# Patient Record
Sex: Female | Born: 1965 | Hispanic: No | Marital: Married | Smoking: Never smoker
Health system: Southern US, Community
[De-identification: ages and names within clinical notes are randomized; demographics above are authoritative.]

---

## 1999-04-18 ENCOUNTER — Encounter (HOSPITAL_COMMUNITY): Admission: RE | Admit: 1999-04-18 | Discharge: 1999-07-17 | Payer: Self-pay | Admitting: Obstetrics and Gynecology

## 1999-07-19 ENCOUNTER — Encounter: Admission: RE | Admit: 1999-07-19 | Discharge: 1999-10-17 | Payer: Self-pay | Admitting: Obstetrics and Gynecology

## 1999-11-16 ENCOUNTER — Encounter: Admission: RE | Admit: 1999-11-16 | Discharge: 2000-02-14 | Payer: Self-pay | Admitting: Obstetrics and Gynecology

## 2000-02-16 ENCOUNTER — Encounter: Admission: RE | Admit: 2000-02-16 | Discharge: 2000-05-16 | Payer: Self-pay | Admitting: Obstetrics and Gynecology

## 2000-05-18 ENCOUNTER — Encounter: Admission: RE | Admit: 2000-05-18 | Discharge: 2000-08-02 | Payer: Self-pay | Admitting: Obstetrics and Gynecology

## 2017-03-14 ENCOUNTER — Encounter (HOSPITAL_BASED_OUTPATIENT_CLINIC_OR_DEPARTMENT_OTHER): Payer: Self-pay | Admitting: Emergency Medicine

## 2017-03-14 ENCOUNTER — Emergency Department (HOSPITAL_BASED_OUTPATIENT_CLINIC_OR_DEPARTMENT_OTHER)
Admission: EM | Admit: 2017-03-14 | Discharge: 2017-03-14 | Disposition: A | Discharge status: Home / Self Care | Payer: Managed Care, Other (non HMO) | Attending: Emergency Medicine | Admitting: Emergency Medicine

## 2017-03-14 DIAGNOSIS — R21 Rash and other nonspecific skin eruption: Secondary | ICD-10-CM | POA: Diagnosis present

## 2017-03-14 DIAGNOSIS — B0230 Zoster ocular disease, unspecified: Secondary | ICD-10-CM | POA: Diagnosis not present

## 2017-03-14 MED ORDER — FLUORESCEIN SODIUM 0.6 MG OP STRP
1.0000 | ORAL_STRIP | Freq: Once | OPHTHALMIC | Status: AC
  Administered 2017-03-14: 1 via OPHTHALMIC
  Filled 2017-03-14: qty 1

## 2017-03-14 MED ORDER — PREDNISONE 50 MG PO TABS
60.0000 mg | ORAL_TABLET | Freq: Once | ORAL | Status: AC
  Administered 2017-03-14: 60 mg via ORAL
  Filled 2017-03-14: qty 1

## 2017-03-14 MED ORDER — TETRACAINE HCL 0.5 % OP SOLN
OPHTHALMIC | Status: AC
  Filled 2017-03-14: qty 4

## 2017-03-14 MED ORDER — FLUORESCEIN SODIUM 0.6 MG OP STRP
ORAL_STRIP | OPHTHALMIC | Status: AC
  Filled 2017-03-14: qty 1

## 2017-03-14 MED ORDER — PREDNISONE 20 MG PO TABS: ORAL_TABLET | ORAL | 0 refills | Status: DC

## 2017-03-14 MED ORDER — VALACYCLOVIR HCL 1 G PO TABS: 1000.0000 mg | ORAL_TABLET | Freq: Three times a day (TID) | ORAL | 0 refills | Status: AC

## 2017-03-14 MED ORDER — TETRACAINE HCL 0.5 % OP SOLN
1.0000 [drp] | Freq: Once | OPHTHALMIC | Status: AC
  Administered 2017-03-14: 1 [drp] via OPHTHALMIC

## 2017-03-14 MED ORDER — POLYMYXIN B-TRIMETHOPRIM 10000-0.1 UNIT/ML-% OP SOLN: 1.0000 [drp] | Freq: Four times a day (QID) | OPHTHALMIC | 0 refills | Status: DC

## 2017-03-14 MED ORDER — HYDROCODONE-ACETAMINOPHEN 5-325 MG PO TABS: 1.0000 | ORAL_TABLET | Freq: Four times a day (QID) | ORAL | 0 refills | Status: DC | PRN

## 2017-03-14 NOTE — Discharge Instructions (Signed)
Take prednisone and valtrex as prescribed for shingles.   Use polytrim to left eye every 6 hrs. Your eye is red likely from the shingles.   See eye doctor in 2-3 days to get a detailed eye exam   See your primary care doctor.   Take vicodin for severe pain. Do not drive with it   Return to ER if you have uncontrolled pain, blurry vision, fevers, headaches, vomiting.

## 2017-03-14 NOTE — ED Provider Notes (Signed)
[facility]   CSN: [id number] Arrival date & time: 03/14/17  2025     History   Chief Complaint Chief Complaint  Patient presents with  . Facial Swelling  . Rash    HPI Alexis Levy is a 51 y.o. female history of previous chickenpox here presenting with facial rash. Patient noticed rash on the left upper face for the last 2-3 days. Patient states that it is itchy as well as it's painful. She also noticed some swelling of the left upper eyelid and had some discharge in the left eye. Denies any fevers or chills.    The history is provided by the patient.    History reviewed. No pertinent past medical history.  There are no active problems to display for this patient.   History reviewed. No pertinent surgical history.  OB History    No data available       Home Medications    Prior to Admission medications   Not on File    Family History No family history on file.  Social History Social History  Substance Use Topics  . Smoking status: Never Smoker  . Smokeless tobacco: Never Used  . Alcohol use No     Allergies   Patient has no known allergies.   Review of Systems Review of Systems  Eyes: Positive for pain.  Skin: Positive for rash.  All other systems reviewed and are negative.    Physical Exam Updated Vital Signs BP (!) 150/89 (BP Location: Left Arm)   Pulse 88   Temp 98 F (36.7 C) (Oral)   Resp 19   SpO2 100%   Physical Exam  Constitutional: She is oriented to person, place, and time. She appears well-developed.  HENT:  Head: Normocephalic.  Vesicular rash L V1 distribution with involvement L upper eyelid.   Eyes: Pupils are equal, round, and reactive to light.  There is no obvious pseudodendrites on fluorescein stain. Mild conjunctivitis. L upper eyelid swollen. ? Small foreign body right below pupil, I numbed up the eye and used a Q tip and it is on the sclera and likely chronic growth.   Neck: Normal  range of motion.  Cardiovascular: Normal rate, regular rhythm and normal heart sounds.   Pulmonary/Chest: Effort normal and breath sounds normal. No respiratory distress. She has no wheezes.  Abdominal: Soft. Bowel sounds are normal. She exhibits no distension. There is no tenderness.  Musculoskeletal: Normal range of motion.  Neurological: She is alert and oriented to person, place, and time. No cranial nerve deficit. Coordination normal.  Skin: Skin is warm. Rash noted.  Psychiatric: She has a normal mood and affect.  Nursing note and vitals reviewed.    ED Treatments / Results  Labs (all labs ordered are listed, but only abnormal results are displayed) Labs Reviewed - No data to display  EKG  EKG Interpretation None       Radiology No results found.  Procedures Procedures (including critical care time)  Medications Ordered in ED Medications  fluorescein 0.6 MG ophthalmic strip (not administered)  tetracaine (PONTOCAINE) 0.5 % ophthalmic solution 1 drop (not administered)  tetracaine (PONTOCAINE) 0.5 % ophthalmic solution (not administered)  fluorescein ophthalmic strip 1 strip (1 strip Left Eye Given 03/14/17 2145)  predniSONE (DELTASONE) tablet 60 mg (60 mg Oral Given 03/14/17 2145)     Initial Impression / Assessment and Plan / ED Course  I have reviewed the triage vital signs and the nursing notes.  Pertinent labs &  imaging results that were available during my care of the patient were reviewed by me and considered in my medical decision making (see chart for details).     Alexis Levy is a 51 y.o. female here with rash on L upper face in V 1 distribution. Likely shingles. There is involvement of the L upper eyelid. ? Mild conjunctivitis but no pseudodendrites. I thought initially that there was a foreign body there is no corneal abrasions visible on fluorescein and it seemed to be in the sclera. Will give valtrex, prednisone, polytrim for the eye. Will refer to  ophtho outpatient for more extensive eye exam.    Final Clinical Impressions(s) / ED Diagnoses   Final diagnoses:  None    New Prescriptions New Prescriptions   No medications on file     Charlynne Pander, MD 03/14/17 2159

## 2017-03-14 NOTE — ED Notes (Signed)
No changes, ready to go, given Rx x4, steady gait, denies questions or needs, VSS

## 2017-03-14 NOTE — ED Triage Notes (Addendum)
PT presents with c/o swelling and bumps of unknown origin to left side of forehead. Rash noted to left side of forehead and around eye. Left eye red and drainage noted.

## 2017-03-21 ENCOUNTER — Encounter (HOSPITAL_BASED_OUTPATIENT_CLINIC_OR_DEPARTMENT_OTHER): Payer: Self-pay | Admitting: Emergency Medicine

## 2017-03-21 ENCOUNTER — Emergency Department (HOSPITAL_BASED_OUTPATIENT_CLINIC_OR_DEPARTMENT_OTHER): Payer: Managed Care, Other (non HMO)

## 2017-03-21 ENCOUNTER — Emergency Department (HOSPITAL_BASED_OUTPATIENT_CLINIC_OR_DEPARTMENT_OTHER)
Admission: EM | Admit: 2017-03-21 | Discharge: 2017-03-21 | Disposition: A | Discharge status: Home / Self Care | Payer: Managed Care, Other (non HMO) | Attending: Emergency Medicine | Admitting: Emergency Medicine

## 2017-03-21 DIAGNOSIS — G4489 Other headache syndrome: Secondary | ICD-10-CM

## 2017-03-21 DIAGNOSIS — Z79899 Other long term (current) drug therapy: Secondary | ICD-10-CM | POA: Insufficient documentation

## 2017-03-21 DIAGNOSIS — B028 Zoster with other complications: Secondary | ICD-10-CM

## 2017-03-21 DIAGNOSIS — R51 Headache: Secondary | ICD-10-CM | POA: Diagnosis present

## 2017-03-21 LAB — COMPREHENSIVE METABOLIC PANEL
ALBUMIN: 4 g/dL (ref 3.5–5.0)
ALT: 37 U/L (ref 14–54)
AST: 38 U/L (ref 15–41)
Alkaline Phosphatase: 91 U/L (ref 38–126)
Anion gap: 9 (ref 5–15)
BUN: 13 mg/dL (ref 6–20)
CALCIUM: 9.1 mg/dL (ref 8.9–10.3)
CHLORIDE: 99 mmol/L — AB (ref 101–111)
CO2: 28 mmol/L (ref 22–32)
CREATININE: 1.21 mg/dL — AB (ref 0.44–1.00)
GFR calc non Af Amer: 51 mL/min — ABNORMAL LOW (ref >60)
GFR, EST AFRICAN AMERICAN: 59 mL/min — AB (ref >60)
GLUCOSE: 125 mg/dL — AB (ref 65–99)
Potassium: 3.5 mmol/L (ref 3.5–5.1)
SODIUM: 136 mmol/L (ref 135–145)
Total Bilirubin: 1.3 mg/dL — ABNORMAL HIGH (ref 0.3–1.2)
Total Protein: 8.7 g/dL — ABNORMAL HIGH (ref 6.5–8.1)

## 2017-03-21 LAB — CBC WITH DIFFERENTIAL/PLATELET
BASOS PCT: 0 %
Basophils Absolute: 0 10*3/uL (ref 0.0–0.1)
EOS ABS: 0 10*3/uL (ref 0.0–0.7)
EOS PCT: 0 %
HEMATOCRIT: 38.9 % (ref 36.0–46.0)
Hemoglobin: 12.8 g/dL (ref 12.0–15.0)
LYMPHS ABS: 3 10*3/uL (ref 0.7–4.0)
Lymphocytes Relative: 19 %
MCH: 29.2 pg (ref 26.0–34.0)
MCHC: 32.9 g/dL (ref 30.0–36.0)
MCV: 88.8 fL (ref 78.0–100.0)
MONO ABS: 2.1 10*3/uL — AB (ref 0.1–1.0)
MONOS PCT: 13 %
Neutro Abs: 11.1 10*3/uL — ABNORMAL HIGH (ref 1.7–7.7)
Neutrophils Relative %: 68 %
PLATELETS: 225 10*3/uL (ref 150–400)
RBC: 4.38 MIL/uL (ref 3.87–5.11)
RDW: 12.9 % (ref 11.5–15.5)
WBC: 16.3 10*3/uL — ABNORMAL HIGH (ref 4.0–10.5)

## 2017-03-21 LAB — SEDIMENTATION RATE: SED RATE: 65 mm/h — AB (ref 0–22)

## 2017-03-21 MED ORDER — ACETAMINOPHEN 500 MG PO TABS
1000.0000 mg | ORAL_TABLET | Freq: Once | ORAL | Status: AC
  Administered 2017-03-21: 1000 mg via ORAL
  Filled 2017-03-21: qty 2

## 2017-03-21 MED ORDER — METOCLOPRAMIDE HCL 5 MG/ML IJ SOLN
10.0000 mg | Freq: Once | INTRAMUSCULAR | Status: AC
  Administered 2017-03-21: 10 mg via INTRAVENOUS
  Filled 2017-03-21: qty 2

## 2017-03-21 MED ORDER — HYDROCODONE-ACETAMINOPHEN 5-325 MG PO TABS: 1.0000 | ORAL_TABLET | ORAL | 0 refills | Status: AC | PRN

## 2017-03-21 MED ORDER — DIPHENHYDRAMINE HCL 50 MG/ML IJ SOLN
25.0000 mg | Freq: Once | INTRAMUSCULAR | Status: AC
  Administered 2017-03-21: 25 mg via INTRAVENOUS
  Filled 2017-03-21: qty 1

## 2017-03-21 MED ORDER — SODIUM CHLORIDE 0.9 % IV BOLUS (SEPSIS)
1000.0000 mL | Freq: Once | INTRAVENOUS | Status: AC
  Administered 2017-03-21: 1000 mL via INTRAVENOUS

## 2017-03-21 MED ORDER — SODIUM CHLORIDE 0.9 % IV SOLN: 1000.0000 mL | INTRAVENOUS | Status: DC

## 2017-03-21 MED ORDER — IBUPROFEN 600 MG PO TABS: 600.0000 mg | ORAL_TABLET | Freq: Four times a day (QID) | ORAL | 0 refills | Status: DC | PRN

## 2017-03-21 MED ORDER — VALACYCLOVIR HCL 1 G PO TABS: 1000.0000 mg | ORAL_TABLET | Freq: Three times a day (TID) | ORAL | 0 refills | Status: AC

## 2017-03-21 NOTE — ED Triage Notes (Signed)
Patient states that she has a headache to the left portion of her head. Denies any N/V - patient reports that she has had this for 1 week

## 2017-03-21 NOTE — ED Notes (Signed)
Was called to reassess the patient by daughter who thinks that patient is having a stroke. Patient denies any changes, reports only a headache and states that it feels like her head is squeezing. No neuro deficits in triage upon reassessment. VSS

## 2017-03-21 NOTE — ED Provider Notes (Signed)
[facility]   CSN: [id number] Arrival date & time: 03/21/17  1147     History   Chief Complaint Chief Complaint  Patient presents with  . Headache    HPI Alexis Levy is a 51 y.o. female.  HPI Patient was seen on 9\23 with facial rash and diagnosed with zoster. Patient completed her Valtrex prescription and has been taking her prednisone. She reports she has follow-up with ophthalmology tomorrow. She reports the rash is improving significantly. She reports however she has had now a persistent left-sided headache is throbbing in quality for several days. No photophobia no neck stiffness no nausea or vomiting no visual changes. Patient reports she has felt generally more fatigued than normal. She has no imbalance no focal weakness numbness or tingling. No confusion. History reviewed. No pertinent past medical history.  There are no active problems to display for this patient.   History reviewed. No pertinent surgical history.  OB History    No data available       Home Medications    Prior to Admission medications   Medication Sig Start Date End Date Taking? Authorizing Provider  HYDROcodone-acetaminophen (NORCO/VICODIN) 5-325 MG tablet Take 1 tablet by mouth every 6 (six) hours as needed. 03/14/17   Charlynne Pander, MD  HYDROcodone-acetaminophen (NORCO/VICODIN) 5-325 MG tablet Take 1-2 tablets by mouth every 4 (four) hours as needed for moderate pain or severe pain. 03/21/17   Arby Barrette, MD  ibuprofen (ADVIL,MOTRIN) 600 MG tablet Take 1 tablet (600 mg total) by mouth every 6 (six) hours as needed. 03/21/17   Arby Barrette, MD  predniSONE (DELTASONE) 20 MG tablet Take 60 mg daily x 2 days then 40 mg daily x 2 days then 20 mg daily x 2 days 03/14/17   Charlynne Pander, MD  trimethoprim-polymyxin b Joaquim Lai) ophthalmic solution Place 1 drop into the right eye every 6 (six) hours. 03/14/17   Charlynne Pander, MD  valACYclovir (VALTREX)  1000 MG tablet Take 1 tablet (1,000 mg total) by mouth 3 (three) times daily. 03/14/17 03/21/17  Charlynne Pander, MD  valACYclovir (VALTREX) 1000 MG tablet Take 1 tablet (1,000 mg total) by mouth 3 (three) times daily. 03/21/17 04/04/17  Arby Barrette, MD    Family History History reviewed. No pertinent family history.  Social History Social History  Substance Use Topics  . Smoking status: Never Smoker  . Smokeless tobacco: Never Used  . Alcohol use No     Allergies   Patient has no known allergies.   Review of Systems Review of Systems 10 Systems reviewed and are negative for acute change except as noted in the HPI.   Physical Exam Updated Vital Signs BP (!) 127/92   Pulse 98   Temp (!) 100.7 F (38.2 C) (Oral)   Resp 18   Ht  (1.626 m)   Wt 113.4 kg (250 lb)   SpO2 100%   BMI 42.91 kg/m   Physical Exam  Constitutional: She is oriented to person, place, and time. She appears well-developed and well-nourished. No distress.  HENT:  Head: Normocephalic and atraumatic.  Nose: Nose normal.  Mouth/Throat: Oropharynx is clear and moist.  Left TM is normal. No lesions within the ear canal or on the pinna. No lesions on the nose. No lesions on the eyelids. No periorbital swelling. Patient has crusted, mild lesions on the temple which are resolving well. These are few and scattered.  Eyes: Pupils are equal, round, and reactive to  light. Conjunctivae and EOM are normal.  Neck: Neck supple.  No meningismus. Patient can fully extend her chin to her chest.  Cardiovascular: Normal rate and regular rhythm.   No murmur heard. Pulmonary/Chest: Effort normal and breath sounds normal. No respiratory distress.  Abdominal: Soft. There is no tenderness.  Musculoskeletal: Normal range of motion. She exhibits no edema, tenderness or deformity.  Neurological: She is alert and oriented to person, place, and time. No cranial nerve deficit. She exhibits normal muscle tone.  Coordination normal.  Patient's mental status is normal. She does not have any somnolence or signs of confusion or cognitive dysfunction. Speech is clear. All movements recorded in a purposeful symmetric  Skin: Skin is warm and dry.  Subtle, resolving rash on left temple and scalp no other body rash.  Psychiatric: She has a normal mood and affect.  Nursing note and vitals reviewed.    ED Treatments / Results  Labs (all labs ordered are listed, but only abnormal results are displayed) Labs Reviewed  COMPREHENSIVE METABOLIC PANEL - Abnormal; Notable for the following:       Result Value   Chloride 99 (*)    Glucose, Bld 125 (*)    Creatinine, Ser 1.21 (*)    Total Protein 8.7 (*)    Total Bilirubin 1.3 (*)    GFR calc non Af Amer 51 (*)    GFR calc Af Amer 59 (*)    All other components within normal limits  CBC WITH DIFFERENTIAL/PLATELET - Abnormal; Notable for the following:    WBC 16.3 (*)    Neutro Abs 11.1 (*)    Monocytes Absolute 2.1 (*)    All other components within normal limits  CULTURE, BLOOD (ROUTINE X 2)  CULTURE, BLOOD (ROUTINE X 2)  SEDIMENTATION RATE    EKG  EKG Interpretation None       Radiology Ct Head Wo Contrast  Result Date: 03/21/2017 CLINICAL DATA:  51 year old female with acute headache for 3 days. EXAM: CT HEAD WITHOUT CONTRAST TECHNIQUE: Contiguous axial images were obtained from the base of the skull through the vertex without intravenous contrast. COMPARISON:  None. FINDINGS: Brain: No evidence of infarction, hemorrhage, hydrocephalus, extra-axial collection or mass lesion/mass effect. Vascular: No hyperdense vessel or unexpected calcification. Skull: Normal. Negative for fracture or focal lesion. Sinuses/Orbits: No acute finding. Other: None. IMPRESSION: Unremarkable noncontrast head CT. Electronically Signed   By: Harmon Pier M.D.   On: 03/21/2017 15:44    Procedures Procedures (including critical care time)  Medications Ordered in  ED Medications  sodium chloride 0.9 % bolus 1,000 mL (1,000 mLs Intravenous New Bag/Given 03/21/17 1450)    Followed by  0.9 %  sodium chloride infusion (not administered)  metoCLOPramide (REGLAN) injection 10 mg (10 mg Intravenous Given 03/21/17 1454)  diphenhydrAMINE (BENADRYL) injection 25 mg (25 mg Intravenous Given 03/21/17 1452)  acetaminophen (TYLENOL) tablet 1,000 mg (1,000 mg Oral Given 03/21/17 1434)     Initial Impression / Assessment and Plan / ED Course  I have reviewed the triage vital signs and the nursing notes.  Pertinent labs & imaging results that were available during my care of the patient were reviewed by me and considered in my medical decision making (see chart for details).     Recheck: Patient feels much better. She is alert and nontoxic. No distress. No somnolence. Mental status is clear.  Final Clinical Impressions(s) / ED Diagnoses   Final diagnoses:  Other headache syndrome  Herpes zoster with complication  At this time, I do feel the patient's headache is postherpetic neuralgia type pain. Her mental status is clear with no somnolence or cognitive dysfunction. At this time, I do not suspect encephalitis or encephalopathy. Patient's headache was resolved with migraine cocktail of Reglan and Benadryl. On recheck, her mental status is clear and alert and well and appearance. Patient is to be seen by ophthalmology tomorrow. She is denying photophobia or ocular pain. I do not see any evident. Ocular lesions or periorbital swelling. Patient has been taking prednisone. At this time as she is having headache and do still to be seen by ophthalmology I will opt to continue the Valtrex empirically till the end of next week. We'll have the patient initiate anti-inflammatories with ibuprofen and Vicodin for pain control. Patient is counseled on necessity to return immediately should there be any signs of worsening or change in condition. Family member present for review of  return precautions. New Prescriptions New Prescriptions   HYDROCODONE-ACETAMINOPHEN (NORCO/VICODIN) 5-325 MG TABLET    Take 1-2 tablets by mouth every 4 (four) hours as needed for moderate pain or severe pain.   IBUPROFEN (ADVIL,MOTRIN) 600 MG TABLET    Take 1 tablet (600 mg total) by mouth every 6 (six) hours as needed.   VALACYCLOVIR (VALTREX) 1000 MG TABLET    Take 1 tablet (1,000 mg total) by mouth 3 (three) times daily.     Arby Barrette, MD 03/21/17 [phone]

## 2017-03-27 LAB — CULTURE, BLOOD (ROUTINE X 2)
CULTURE: NO GROWTH
Culture: NO GROWTH
Special Requests: ADEQUATE
Special Requests: ADEQUATE

## 2019-11-24 ENCOUNTER — Other Ambulatory Visit: Payer: Self-pay

## 2019-11-24 ENCOUNTER — Emergency Department (HOSPITAL_BASED_OUTPATIENT_CLINIC_OR_DEPARTMENT_OTHER)
Admission: EM | Admit: 2019-11-24 | Discharge: 2019-11-24 | Disposition: A | Discharge status: Home / Self Care | Payer: Commercial Managed Care - PPO | Attending: Emergency Medicine | Admitting: Emergency Medicine

## 2019-11-24 ENCOUNTER — Emergency Department (HOSPITAL_BASED_OUTPATIENT_CLINIC_OR_DEPARTMENT_OTHER): Payer: Commercial Managed Care - PPO

## 2019-11-24 ENCOUNTER — Encounter (HOSPITAL_BASED_OUTPATIENT_CLINIC_OR_DEPARTMENT_OTHER): Payer: Self-pay

## 2019-11-24 DIAGNOSIS — Y929 Unspecified place or not applicable: Secondary | ICD-10-CM | POA: Diagnosis not present

## 2019-11-24 DIAGNOSIS — Y999 Unspecified external cause status: Secondary | ICD-10-CM | POA: Diagnosis not present

## 2019-11-24 DIAGNOSIS — S59902A Unspecified injury of left elbow, initial encounter: Secondary | ICD-10-CM | POA: Diagnosis present

## 2019-11-24 DIAGNOSIS — S52125A Nondisplaced fracture of head of left radius, initial encounter for closed fracture: Secondary | ICD-10-CM | POA: Diagnosis not present

## 2019-11-24 DIAGNOSIS — W010XXA Fall on same level from slipping, tripping and stumbling without subsequent striking against object, initial encounter: Secondary | ICD-10-CM | POA: Diagnosis not present

## 2019-11-24 DIAGNOSIS — Y939 Activity, unspecified: Secondary | ICD-10-CM | POA: Diagnosis not present

## 2019-11-24 MED ORDER — HYDROCODONE-ACETAMINOPHEN 5-325 MG PO TABS
1.0000 | ORAL_TABLET | Freq: Once | ORAL | Status: AC
  Administered 2019-11-24: 1 via ORAL
  Filled 2019-11-24: qty 1

## 2019-11-24 MED ORDER — NAPROXEN 500 MG PO TABS: 500.0000 mg | ORAL_TABLET | Freq: Two times a day (BID) | ORAL | 0 refills | Status: DC

## 2019-11-24 MED ORDER — HYDROCODONE-ACETAMINOPHEN 5-325 MG PO TABS: 1.0000 | ORAL_TABLET | Freq: Four times a day (QID) | ORAL | 0 refills | Status: AC | PRN

## 2019-11-24 NOTE — ED Triage Notes (Signed)
Pt states she slipped/fell ~115pm-pain to left elbow-NAD-steady gait

## 2019-11-24 NOTE — ED Provider Notes (Signed)
[facility] EMERGENCY DEPARTMENT Provider Note   CSN: [id number] Arrival date & time: 11/24/19  1359     History Chief Complaint  Patient presents with   Lytle Michaels    Alexis Levy is a 54 y.o. female with no significant past medical history who presents to the ED after a fall that occurred just prior to arrival.  Patient states she slipped on a wet floor covered in hand sanitizer and fell directly on her left elbow.  She admits to sudden onset of numbness and tingling down her left arm which has completely resolved.  Patient rates her pain a 7/10, worse with movement of her left arm.  No treatment prior to arrival.  Denies head injury and loss of consciousness.  She is not currently on any blood thinners.  History obtained from patient and past medical records. No interpreter used during encounter.      History reviewed. No pertinent past medical history.  There are no problems to display for this patient.   History reviewed. No pertinent surgical history.   OB History   No obstetric history on file.     No family history on file.  Social History   Tobacco Use   Smoking status: Never Smoker   Smokeless tobacco: Never Used  Substance Use Topics   Alcohol use: No   Drug use: No    Home Medications Prior to Admission medications   Medication Sig Start Date End Date Taking? Authorizing Provider  HYDROcodone-acetaminophen (NORCO/VICODIN) 5-325 MG tablet Take 1 tablet by mouth every 6 (six) hours as needed. 03/14/17   Drenda Freeze, MD  HYDROcodone-acetaminophen (NORCO/VICODIN) 5-325 MG tablet Take 1-2 tablets by mouth every 4 (four) hours as needed for moderate pain or severe pain. 03/21/17   Charlesetta Shanks, MD  HYDROcodone-acetaminophen (NORCO/VICODIN) 5-325 MG tablet Take 1 tablet by mouth every 6 (six) hours as needed for severe pain. 11/24/19   Suzy Bouchard, PA-C  ibuprofen (ADVIL,MOTRIN) 600 MG tablet Take 1 tablet (600 mg total) by mouth every  6 (six) hours as needed. 03/21/17   Charlesetta Shanks, MD  naproxen (NAPROSYN) 500 MG tablet Take 1 tablet (500 mg total) by mouth 2 (two) times daily. 11/24/19   Suzy Bouchard, PA-C  predniSONE (DELTASONE) 20 MG tablet Take 60 mg daily x 2 days then 40 mg daily x 2 days then 20 mg daily x 2 days 03/14/17   Drenda Freeze, MD  trimethoprim-polymyxin b Mayra Neer) ophthalmic solution Place 1 drop into the right eye every 6 (six) hours. 03/14/17   Drenda Freeze, MD    Allergies    Patient has no known allergies.  Review of Systems   Review of Systems  Musculoskeletal: Positive for arthralgias (left elbow). Negative for back pain and neck pain.  Neurological: Negative for numbness.  All other systems reviewed and are negative.   Physical Exam Updated Vital Signs BP (!) 148/89 (BP Location: Right Arm)    Pulse 80    Temp 98.3 F (36.8 C) (Oral)    Resp 15    Ht 5\' 4"  (1.626 m)    Wt 110.2 kg    SpO2 99%    BMI 41.71 kg/m   Physical Exam Vitals and nursing note reviewed.  Constitutional:      General: She is not in acute distress.    Appearance: She is not ill-appearing.  HENT:     Head: Normocephalic.  Eyes:     Pupils: Pupils are equal, round,  and reactive to light.  Cardiovascular:     Rate and Rhythm: Normal rate and regular rhythm.     Pulses: Normal pulses.     Heart sounds: Normal heart sounds. No murmur. No friction rub. No gallop.   Pulmonary:     Effort: Pulmonary effort is normal.     Breath sounds: Normal breath sounds.  Abdominal:     General: Abdomen is flat. There is no distension.     Palpations: Abdomen is soft.     Tenderness: There is no abdominal tenderness. There is no guarding or rebound.  Musculoskeletal:     Cervical back: Neck supple.     Comments: Tenderness to palpation over olecranon process and bilateral epicondyles of left arm.  Limited range of motion of left elbow due to pain.  Full range of motion of left shoulder and left wrist with no  tenderness.  Radial pulse intact.  Soft compartments.  Skin:    General: Skin is warm and dry.  Neurological:     General: No focal deficit present.     Mental Status: She is alert.  Psychiatric:        Mood and Affect: Mood normal.        Behavior: Behavior normal.     ED Results / Procedures / Treatments   Labs (all labs ordered are listed, but only abnormal results are displayed) Labs Reviewed - No data to display  EKG None  Radiology DG Elbow Complete Left  Result Date: 11/24/2019 CLINICAL DATA:  Fall today EXAM: LEFT ELBOW - COMPLETE 3+ VIEW COMPARISON:  None. FINDINGS: Fracture of the radial head. No other fracture or arthropathy. Joint effusion. IMPRESSION: Acute fracture radial head. Electronically Signed   By: Marlan Palau M.D.   On: 11/24/2019 14:46    Procedures Procedures (including critical care time)  Medications Ordered in ED Medications  HYDROcodone-acetaminophen (NORCO/VICODIN) 5-325 MG per tablet 1 tablet (has no administration in time range)    ED Course  I have reviewed the triage vital signs and the nursing notes.  Pertinent labs & imaging results that were available during my care of the patient were reviewed by me and considered in my medical decision making (see chart for details).  Clinical Course as of Nov 23 1528  Fri Nov 24, 2019  1523 Discussed case with Dr. Ave Filter with orthopedics who recommends posterior long arm splint and follow-up in office early next week.    [CA]    Clinical Course User Index [CA] Mannie Stabile, PA-C   MDM Rules/Calculators/A&P                     54 year old female presents to the ED after a fall directly on her left elbow.  No head injury or loss of consciousness.  Upon arrival, stable vitals.  Patient in no acute distress and non-ill-appearing. Tenderness to palpation over olecranon process and bilateral epicondyles of left arm.  Limited range of motion of left elbow due to pain.  Full range of motion of  left shoulder and left wrist with no tenderness.  Radial pulse intact.  Soft compartments. Doubt compartment syndrome. X-ray ordered at triage. Hydrocodone given for pain management here in the ED.  X-ray personally reviewed which demonstrates an acute radial head fracture. Discussed case with Dr. Ave Filter. See note above. Posterior long arm splint placed here in the ED with sling. Dr. Veda Canning number given to patient at discharge. Advised patient to call today to schedule an  appointment for further evaluation. Will discharge patient with pain medication. Strict ED precautions discussed with patient. Patient states understanding and agrees to plan. Patient discharged home in no acute distress and stable vitals.  Discussed case with Dr. Stevie Kern who agrees with assessment and plan.   Final Clinical Impression(s) / ED Diagnoses Final diagnoses:  Closed nondisplaced fracture of head of left radius, initial encounter    Rx / DC Orders ED Discharge Orders         Ordered    HYDROcodone-acetaminophen (NORCO/VICODIN) 5-325 MG tablet  Every 6 hours PRN     11/24/19 1529    naproxen (NAPROSYN) 500 MG tablet  2 times daily     11/24/19 1529           Mannie Stabile, PA-C 11/24/19 1601    Milagros Loll, MD 11/26/19 [phone]

## 2019-11-24 NOTE — Discharge Instructions (Addendum)
As discussed, your x-ray showed a radial fracture.  I am sending you home with 2 different pain medications.  Save the hydrocodone for severe pain and take naproxen as needed for mild to moderate pain.  I have included the number of the orthopedic surgeon.  Please call this afternoon to schedule an appointment for early next week for further evaluation.  Return to the ER for new or worsening symptoms.

## 2021-06-10 IMAGING — DX DG ELBOW COMPLETE 3+V*L*
4 series · 4 of 4 positions shown · non-contrast
Comparison: None.

CLINICAL DATA: Fall today

EXAM:
LEFT ELBOW - COMPLETE 3+ VIEW

[elbow ap]
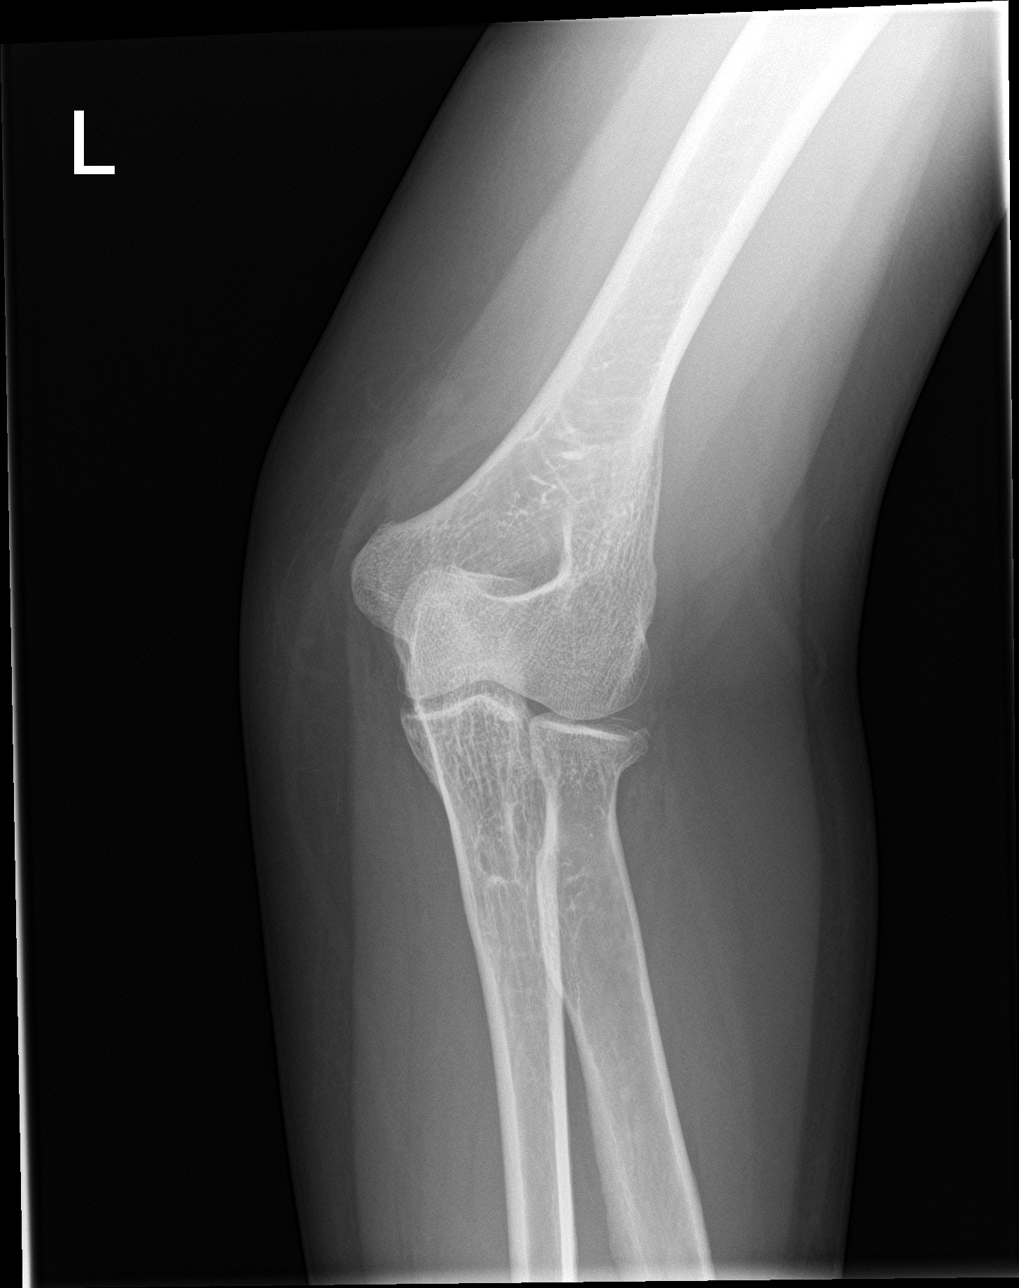

[elbow obl (1 of 2)]
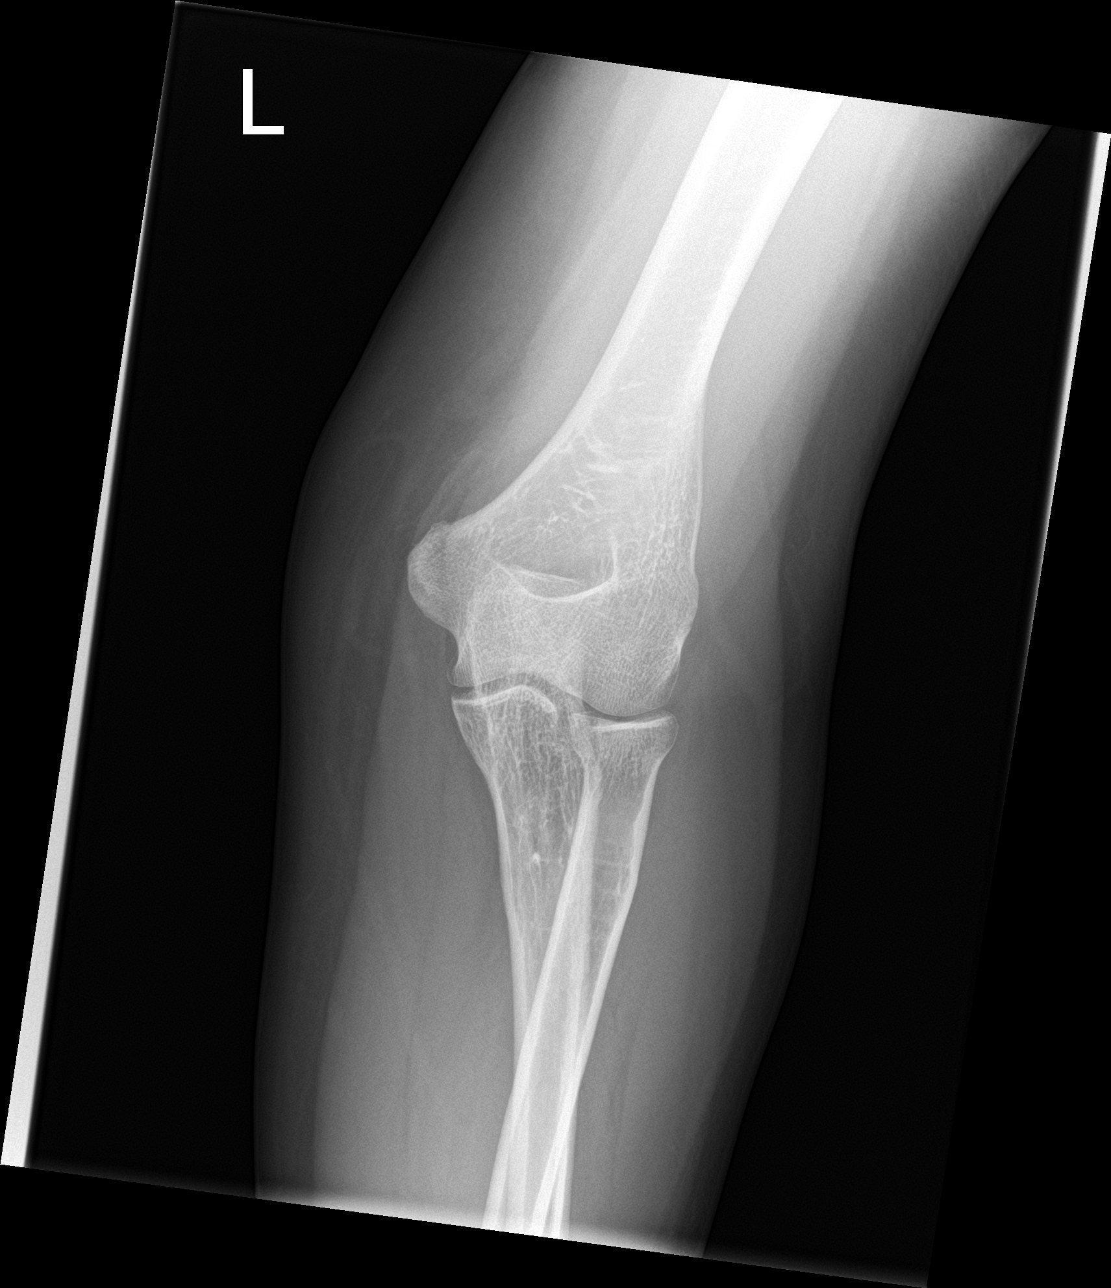

[elbow obl (2 of 2)]
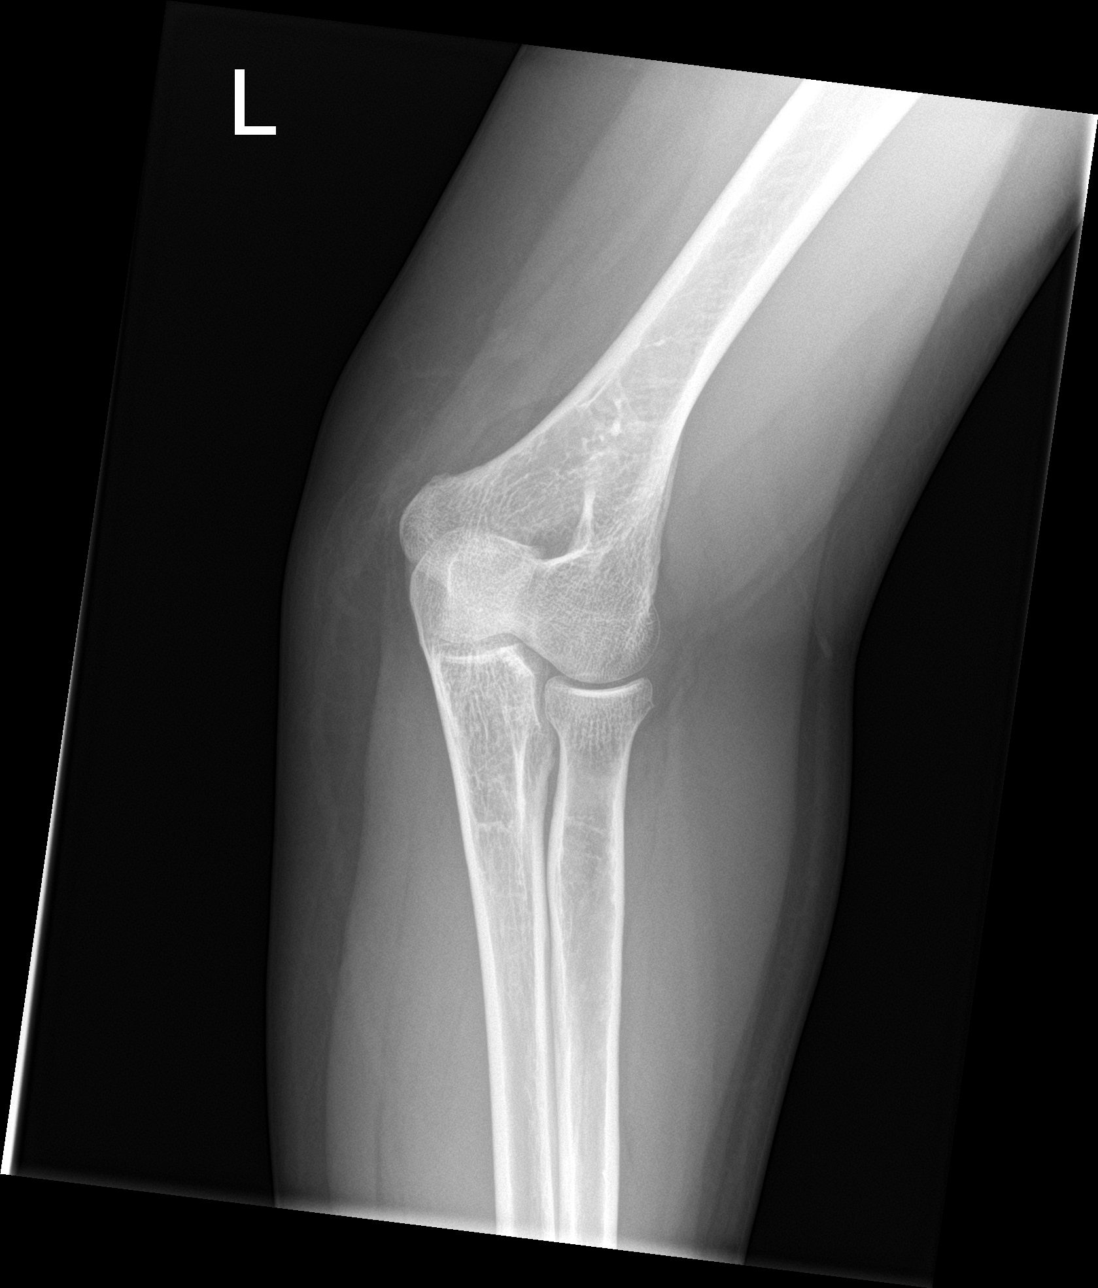

[elbow lat]
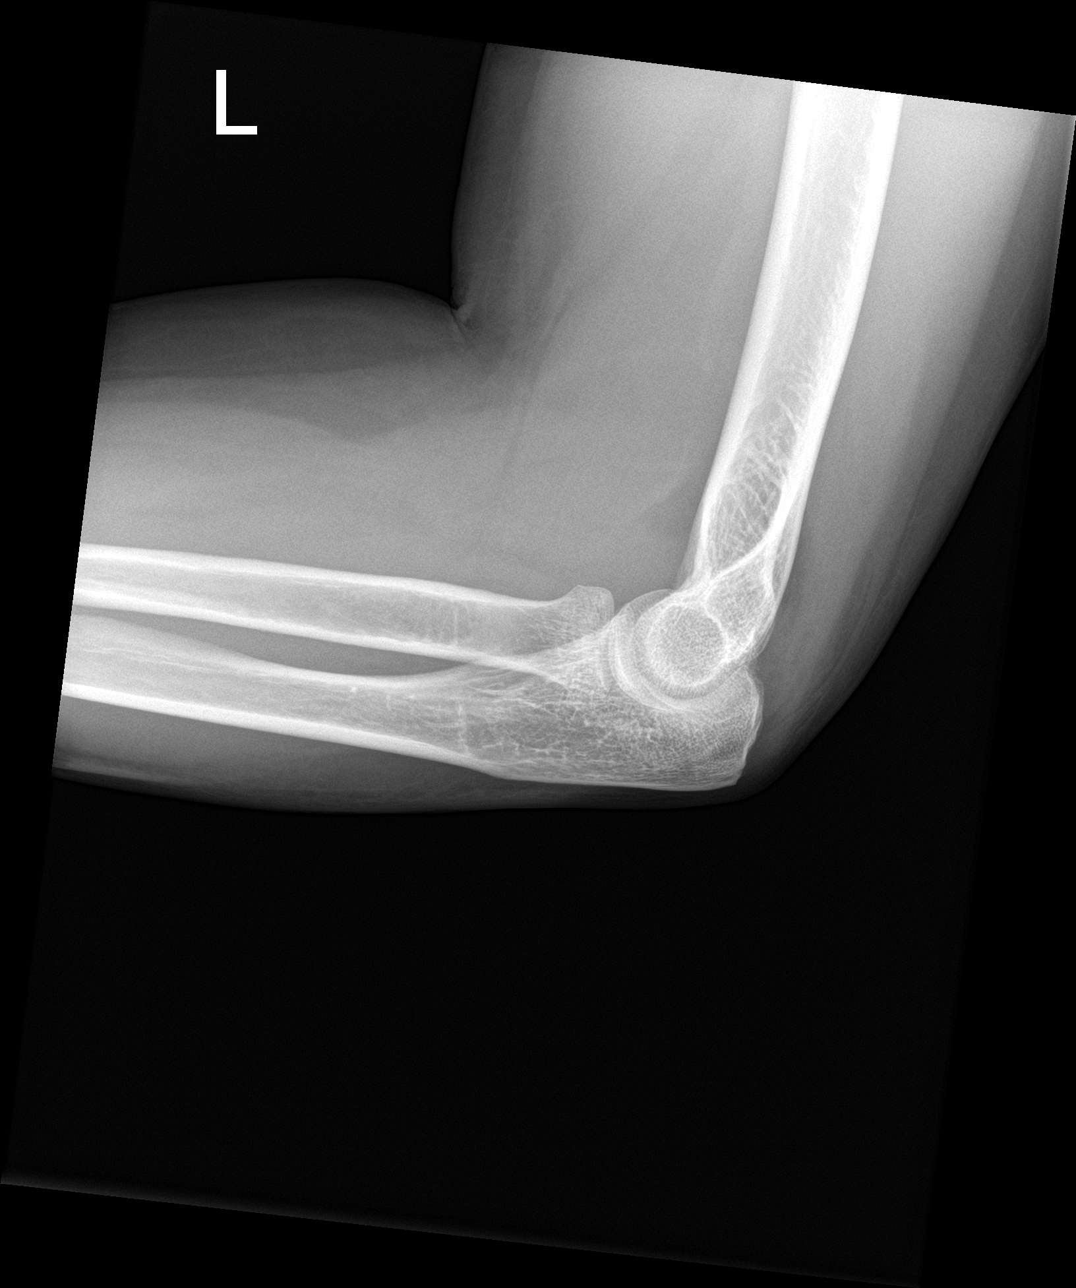

[4 of 4 positions shown; findings below may reference images not displayed]

FINDINGS: Fracture of the radial head. No other fracture or arthropathy. Joint
effusion.
IMPRESSION: Acute fracture radial head.

## 2022-09-18 ENCOUNTER — Encounter (HOSPITAL_COMMUNITY): Payer: Self-pay

## 2022-09-18 ENCOUNTER — Ambulatory Visit (HOSPITAL_COMMUNITY)
Admission: EM | Admit: 2022-09-18 | Discharge: 2022-09-18 | Disposition: A | Discharge status: Home / Self Care | Payer: Commercial Managed Care - PPO | Attending: Internal Medicine | Admitting: Internal Medicine

## 2022-09-18 DIAGNOSIS — M25561 Pain in right knee: Secondary | ICD-10-CM

## 2022-09-18 MED ORDER — PREDNISONE 20 MG PO TABS
20.0000 mg | ORAL_TABLET | Freq: Once | ORAL | Status: AC
  Administered 2022-09-18: 20 mg via ORAL

## 2022-09-18 MED ORDER — METHYLPREDNISOLONE 4 MG PO TBPK: ORAL_TABLET | ORAL | 0 refills | Status: AC

## 2022-09-18 MED ORDER — PREDNISONE 20 MG PO TABS
ORAL_TABLET | ORAL | Status: AC
  Filled 2022-09-18: qty 1

## 2022-09-18 NOTE — ED Triage Notes (Signed)
Pt states that right knee is having pain for the past week. Has taken tylenol,iced,elevated.

## 2022-09-18 NOTE — Discharge Instructions (Signed)
Take methylprednisolone Dosepak as prescribed. Do not take any naproxen or ibuprofen when taking this medication.  Take this with food to avoid stomach upset.  Keep icing and elevating your right knee.  Apply ice 20 minutes on 20 minutes off to reduce swelling.  Please wear the Ace wrap provided in clinic today to help with compression.  Your work note was at the end of the packet.  I would like for you to stay off of your leg to reduce inflammation and swelling over the next couple of days.  Please follow-up with the orthopedic provider listed on your paperwork for follow-up and ongoing evaluation of your right knee pain.  If you develop any new or worsening symptoms or do not improve, follow-up with orthopedics.  If your symptoms are severe, please go to the emergency room.  Follow-up with your primary care provider for further evaluation and management of your symptoms as well as ongoing wellness visits.  I hope you feel better!

## 2022-09-22 NOTE — ED Provider Notes (Signed)
[facility]    CSN: [id number] Arrival date & time: 09/18/22  1544      History   Chief Complaint Chief Complaint  Patient presents with   Knee Pain    HPI Alexis Levy is a 57 y.o. female.   Patient presents to urgent care for evaluation of persistent right knee pain for the last 1 week. Pain is worse with movement and with ambulation. No recent trauma/injuries to the right knee. Described as throbbing/aching pain. No history of injury or surgical procedure to the right knee. Pain is generalized to the anterior and posterior aspect of the knee. Unrelieved by ibuprofen, tylenol, ice, or elevation. No numbness or tingling distally to pain. Last dose of ibuprofen was yesterday. No history of gout. Denies fever, chills, and generalized body aches.    Knee Pain   History reviewed. No pertinent past medical history.  There are no problems to display for this patient.   History reviewed. No pertinent surgical history.  OB History   No obstetric history on file.      Home Medications    Prior to Admission medications   Medication Sig Start Date End Date Taking? Authorizing Provider  methylPREDNISolone (MEDROL DOSEPAK) 4 MG TBPK tablet Take as directed on box. 09/18/22  Yes Pratik Dalziel, Stasia Cavalier, FNP  HYDROcodone-acetaminophen (NORCO/VICODIN) 5-325 MG tablet Take 1-2 tablets by mouth every 4 (four) hours as needed for moderate pain or severe pain. 03/21/17   Charlesetta Shanks, MD  HYDROcodone-acetaminophen (NORCO/VICODIN) 5-325 MG tablet Take 1 tablet by mouth every 6 (six) hours as needed for severe pain. 11/24/19   Suzy Bouchard, PA-C    Family History History reviewed. No pertinent family history.  Social History Social History   Tobacco Use   Smoking status: Never   Smokeless tobacco: Never  Vaping Use   Vaping Use: Never used  Substance Use Topics   Alcohol use: No   Drug use: No     Allergies   Patient has no known allergies.   Review  of Systems Review of Systems Per HPI  Physical Exam Triage Vital Signs ED Triage Vitals  Enc Vitals Group     BP 09/18/22 1718 (!) 142/104     Pulse Rate 09/18/22 1718 88     Resp 09/18/22 1718 18     Temp 09/18/22 1718 98 F (36.7 C)     Temp Source 09/18/22 1718 Oral     SpO2 09/18/22 1718 97 %     Weight 09/18/22 1717 246 lb (111.6 kg)     Height --      Head Circumference --      Peak Flow --      Pain Score 09/18/22 1717 5     Pain Loc --      Pain Edu? --      Excl. in [place]? --    No data found.  Updated Vital Signs BP (!) 142/104 (BP Location: Right Arm)   Pulse 88   Temp 98 F (36.7 C) (Oral)   Resp 18   Wt 246 lb (111.6 kg)   SpO2 97%   BMI 42.23 kg/m   Visual Acuity Right Eye Distance:   Left Eye Distance:   Bilateral Distance:    Right Eye Near:   Left Eye Near:    Bilateral Near:     Physical Exam Vitals and nursing note reviewed.  Constitutional:      Appearance: She is not ill-appearing or toxic-appearing.  HENT:  Head: Normocephalic and atraumatic.     Right Ear: Hearing and external ear normal.     Left Ear: Hearing and external ear normal.     Nose: Nose normal.     Mouth/Throat:     Lips: Pink.  Eyes:     General: Lids are normal. Vision grossly intact. Gaze aligned appropriately.     Extraocular Movements: Extraocular movements intact.     Conjunctiva/sclera: Conjunctivae normal.  Cardiovascular:     Rate and Rhythm: Normal rate and regular rhythm.     Heart sounds: Normal heart sounds, S1 normal and S2 normal.  Pulmonary:     Effort: Pulmonary effort is normal. No respiratory distress.     Breath sounds: Normal breath sounds and air entry.  Musculoskeletal:     Cervical back: Neck supple.     Right knee: Swelling present. No deformity, effusion, erythema, ecchymosis, lacerations, bony tenderness or crepitus. Normal range of motion. Tenderness present over the medial joint line, lateral joint line and patellar tendon. Normal  alignment, normal meniscus and normal patellar mobility.     Left knee: Normal.  Skin:    General: Skin is warm and dry.     Capillary Refill: Capillary refill takes less than 2 seconds.     Findings: No rash.  Neurological:     General: No focal deficit present.     Mental Status: She is alert and oriented to person, place, and time. Mental status is at baseline.     Cranial Nerves: No dysarthria or facial asymmetry.  Psychiatric:        Mood and Affect: Mood normal.        Speech: Speech normal.        Behavior: Behavior normal.        Thought Content: Thought content normal.        Judgment: Judgment normal.      UC Treatments / Results  Labs (all labs ordered are listed, but only abnormal results are displayed) Labs Reviewed - No data to display  EKG   Radiology No results found.  Procedures Procedures (including critical care time)  Medications Ordered in UC Medications  predniSONE (DELTASONE) tablet 20 mg (20 mg Oral Given 09/18/22 1815)    Initial Impression / Assessment and Plan / UC Course  I have reviewed the triage vital signs and the nursing notes.  Pertinent labs & imaging results that were available during my care of the patient were reviewed by me and considered in my medical decision making (see chart for details).   1. Acute pain of right knee Acute pain of right knee likely tendinopathy related to overuse. Has not responded well to over the counter NSAID, tylenol, and RICE, therefore would like to provide steroid therapy at this time to reduce inflammation, swelling, and discomfort. Prednisone 20mg  one dose given in clinic, patient to start medrol dose pak tomorrow as prescribed with food. No NSAID with dosepak. Continued RICE advised. Knee brace may be helpful to reduce swelling and provide stability. Walking referral to orthopedics provided to be used as needed. Deferred imaging based on atraumatic mechanism of injury and stable MSK findings. Low  suspicion for gout/infectious etiology.  Discussed physical exam and available lab work findings in clinic with patient.  Counseled patient regarding appropriate use of medications and potential side effects for all medications recommended or prescribed today. Discussed red flag signs and symptoms of worsening condition,when to call the PCP office, return to urgent care, and when to  seek higher level of care in the emergency department. Patient verbalizes understanding and agreement with plan. All questions answered. Patient discharged in stable condition.    Final Clinical Impressions(s) / UC Diagnoses   Final diagnoses:  Acute pain of right knee     Discharge Instructions      Take methylprednisolone Dosepak as prescribed. Do not take any naproxen or ibuprofen when taking this medication.  Take this with food to avoid stomach upset.  Keep icing and elevating your right knee.  Apply ice 20 minutes on 20 minutes off to reduce swelling.  Please wear the Ace wrap provided in clinic today to help with compression.  Your work note was at the end of the packet.  I would like for you to stay off of your leg to reduce inflammation and swelling over the next couple of days.  Please follow-up with the orthopedic provider listed on your paperwork for follow-up and ongoing evaluation of your right knee pain.  If you develop any new or worsening symptoms or do not improve, follow-up with orthopedics.  If your symptoms are severe, please go to the emergency room.  Follow-up with your primary care provider for further evaluation and management of your symptoms as well as ongoing wellness visits.  I hope you feel better!   ED Prescriptions     Medication Sig Dispense Auth. Provider   methylPREDNISolone (MEDROL DOSEPAK) 4 MG TBPK tablet Take as directed on box. 1 each Talbot Grumbling, FNP      PDMP not reviewed this encounter.   Talbot Grumbling, [place] 09/24/22 2246
